# Patient Record
Sex: Female | Born: 1985 | Race: White | Hispanic: No | Marital: Married | Smoking: Former smoker
Health system: Southern US, Community
[De-identification: ages and names within clinical notes are randomized; demographics above are authoritative.]

## PROBLEM LIST (undated history)

## (undated) DIAGNOSIS — R011 Cardiac murmur, unspecified: Secondary | ICD-10-CM

## (undated) DIAGNOSIS — F32A Depression, unspecified: Secondary | ICD-10-CM

## (undated) DIAGNOSIS — F329 Major depressive disorder, single episode, unspecified: Secondary | ICD-10-CM

## (undated) HISTORY — DX: Depression, unspecified: F32.A

## (undated) HISTORY — DX: Cardiac murmur, unspecified: R01.1

## (undated) HISTORY — DX: Major depressive disorder, single episode, unspecified: F32.9

---

## 2008-04-02 ENCOUNTER — Ambulatory Visit (HOSPITAL_COMMUNITY): Admission: RE | Admit: 2008-04-02 | Discharge: 2008-04-02 | Payer: Self-pay | Admitting: Obstetrics and Gynecology

## 2008-04-23 ENCOUNTER — Ambulatory Visit (HOSPITAL_COMMUNITY): Admission: RE | Admit: 2008-04-23 | Discharge: 2008-04-23 | Payer: Self-pay | Admitting: Obstetrics and Gynecology

## 2008-05-23 ENCOUNTER — Ambulatory Visit (HOSPITAL_COMMUNITY): Admission: RE | Admit: 2008-05-23 | Discharge: 2008-05-23 | Payer: Self-pay | Admitting: Obstetrics and Gynecology

## 2008-08-04 ENCOUNTER — Inpatient Hospital Stay (HOSPITAL_COMMUNITY): Admission: AD | Admit: 2008-08-04 | Discharge: 2008-08-06 | Payer: Self-pay | Admitting: Obstetrics and Gynecology

## 2010-06-22 LAB — CBC
HCT: 30.4 % — ABNORMAL LOW (ref 36.0–46.0)
HCT: 38.6 % (ref 36.0–46.0)
Hemoglobin: 10.9 g/dL — ABNORMAL LOW (ref 12.0–15.0)
Hemoglobin: 13.5 g/dL (ref 12.0–15.0)
MCHC: 35.7 g/dL (ref 30.0–36.0)
MCV: 97.4 fL (ref 78.0–100.0)
MCV: 97.6 fL (ref 78.0–100.0)
Platelets: 213 10*3/uL (ref 150–400)
RBC: 3.11 MIL/uL — ABNORMAL LOW (ref 3.87–5.11)
RBC: 3.96 MIL/uL (ref 3.87–5.11)
RDW: 13.5 % (ref 11.5–15.5)
WBC: 18.3 10*3/uL — ABNORMAL HIGH (ref 4.0–10.5)

## 2012-03-09 ENCOUNTER — Ambulatory Visit: Payer: BC Managed Care – PPO | Admitting: Family Medicine

## 2012-03-09 VITALS — BP 122/77 | HR 80 | Temp 98.4°F | Resp 18 | Ht 65.0 in | Wt 140.2 lb

## 2012-03-09 DIAGNOSIS — J029 Acute pharyngitis, unspecified: Secondary | ICD-10-CM

## 2012-03-09 NOTE — Progress Notes (Signed)
[facility] [address] [place] [address] [phone]  Date:  03/09/2012   Name:  Darlene Watkins   DOB:  15-Oct-1985   MRN:  [id number]  PCP:  No primary provider on file.    Chief Complaint: Sore Throat   History of Present Illness:  Darlene Watkins is a 26 y.o. very pleasant female patient who presents with the following:  She is here with ST that started yesterday, she noted a white spot on her throat as well.  She has not noted a fever. She felt ill over the last few hours- "general blah feeling"  She has a mild, dry, cough.  No earache She has felt bloated today- no diarrhea.    No sick contacts at home.  She is generally quite healthy, no regular meds.    She has a mirena IUD.    There is no problem list on file for this patient.   Past Medical History  Diagnosis Date  . Depression   . Heart murmur     History reviewed. No pertinent past surgical history.  History  Substance Use Topics  . Smoking status: Current Every Day Smoker -- 1.0 packs/day    Types: Cigarettes  . Smokeless tobacco: Not on file  . Alcohol Use: Yes    Family History  Problem Relation Age of Onset  . Asthma Mother   . Alcohol abuse Father   . Asthma Sister   . Diabetes Maternal Grandmother     No Known Allergies  Medication list has been reviewed and updated.  No current outpatient prescriptions on file prior to visit.    Review of Systems:  As per HPI- otherwise negative.   Physical Examination: Filed Vitals:   03/09/12 1754  BP: 122/77  Pulse: 80  Temp: 98.4 F (36.9 C)  Resp: 18   Filed Vitals:   03/09/12 1754  Height: 5\' 5"  (1.651 m)  Weight: 140 lb 3.2 oz (63.594 kg)   Body mass index is 23.33 kg/(m^2). Ideal Body Weight: Weight in (lb) to have BMI = 25: 149.9   GEN: WDWN, NAD, Non-toxic, A & O x 3 HEENT: Atraumatic, Normocephalic. Neck supple. No masses, No LAD. Bilateral TM wnl, oropharynx slightly injected without  exudate. PEERL,EOMI.   Ears and Nose: No external deformity. CV: RRR, No G/R. No JVD. No thrill. No extra heart sounds. She has a known murmur- systolic, 2/6 PULM: CTA B, no wheezes, crackles, rhonchi. No retractions. No resp. distress. No accessory muscle use. ABD: S, ND, +BS. No rebound. No HSM.  She has very slight LUQ tenderness that she just noted a few hours ago EXTR: No c/c/e NEURO Normal gait.  PSYCH: Normally interactive. Conversant. Not depressed or anxious appearing.  Calm demeanor.   Results for orders placed in visit on 03/09/12  POCT RAPID STREP A (OFFICE)      Component Value Range   Rapid Strep A Screen Negative  Negative    Assessment and Plan: 1. Sore throat  POCT rapid strep A   See patient instructions for more details. slight LUQ tenderness for a few hours.  Jensine feels this is likely gas.  If it does not resolve within the next day she will let me know- Sooner if worse.      Abbe Amsterdam, MD

## 2012-03-09 NOTE — Patient Instructions (Addendum)
It appears that you have a viral sore throat.  Use OTC medications as needed. Let me know if you are not better in the next few days- Sooner if worse.

## 2014-08-02 ENCOUNTER — Encounter (HOSPITAL_BASED_OUTPATIENT_CLINIC_OR_DEPARTMENT_OTHER): Payer: Self-pay

## 2014-08-02 DIAGNOSIS — Z87891 Personal history of nicotine dependence: Secondary | ICD-10-CM | POA: Insufficient documentation

## 2014-08-02 DIAGNOSIS — L03116 Cellulitis of left lower limb: Secondary | ICD-10-CM | POA: Diagnosis not present

## 2014-08-02 DIAGNOSIS — Z8659 Personal history of other mental and behavioral disorders: Secondary | ICD-10-CM | POA: Diagnosis not present

## 2014-08-02 DIAGNOSIS — M7989 Other specified soft tissue disorders: Secondary | ICD-10-CM | POA: Diagnosis present

## 2014-08-02 DIAGNOSIS — L03115 Cellulitis of right lower limb: Secondary | ICD-10-CM | POA: Insufficient documentation

## 2014-08-02 DIAGNOSIS — R011 Cardiac murmur, unspecified: Secondary | ICD-10-CM | POA: Diagnosis not present

## 2014-08-02 NOTE — ED Notes (Signed)
Pt reports bilateral pedal edema +1 pitting since Wednesday, that has progressively worsened with pain. RLE edema noticeably worse. Pt denies Shortness of Breath. Reports being on Mirena. States she had an approximate 10 hour car ride on Saturday. Reports 10 hour drive on 3/875/16 as well.

## 2014-08-03 ENCOUNTER — Ambulatory Visit (HOSPITAL_BASED_OUTPATIENT_CLINIC_OR_DEPARTMENT_OTHER)
Admission: RE | Admit: 2014-08-03 | Discharge: 2014-08-03 | Disposition: A | Discharge status: Home / Self Care | Payer: BLUE CROSS/BLUE SHIELD | Source: Ambulatory Visit | Attending: Emergency Medicine | Admitting: Emergency Medicine

## 2014-08-03 ENCOUNTER — Emergency Department (HOSPITAL_BASED_OUTPATIENT_CLINIC_OR_DEPARTMENT_OTHER)
Admission: EM | Admit: 2014-08-03 | Discharge: 2014-08-03 | Disposition: A | Discharge status: Home / Self Care | Payer: BLUE CROSS/BLUE SHIELD | Attending: Emergency Medicine | Admitting: Emergency Medicine

## 2014-08-03 ENCOUNTER — Other Ambulatory Visit (HOSPITAL_BASED_OUTPATIENT_CLINIC_OR_DEPARTMENT_OTHER): Payer: Self-pay | Admitting: Emergency Medicine

## 2014-08-03 DIAGNOSIS — M79605 Pain in left leg: Secondary | ICD-10-CM | POA: Diagnosis not present

## 2014-08-03 DIAGNOSIS — M7989 Other specified soft tissue disorders: Secondary | ICD-10-CM

## 2014-08-03 DIAGNOSIS — L03115 Cellulitis of right lower limb: Secondary | ICD-10-CM

## 2014-08-03 DIAGNOSIS — M79604 Pain in right leg: Secondary | ICD-10-CM | POA: Diagnosis not present

## 2014-08-03 DIAGNOSIS — L03116 Cellulitis of left lower limb: Secondary | ICD-10-CM

## 2014-08-03 MED ORDER — CEPHALEXIN 500 MG PO CAPS: 500.0000 mg | ORAL_CAPSULE | Freq: Four times a day (QID) | ORAL | Status: DC

## 2014-08-03 NOTE — Discharge Instructions (Signed)
Keflex as prescribed.  Elevate your legs as often as possible for the next several days.  Return to the radiology department tomorrow at the given time for an ultrasound to rule out the possibility of blood clots.   Cellulitis Cellulitis is an infection of the skin and the tissue beneath it. The infected area is usually red and tender. Cellulitis occurs most often in the arms and lower legs.  CAUSES  Cellulitis is caused by bacteria that enter the skin through cracks or cuts in the skin. The most common types of bacteria that cause cellulitis are staphylococci and streptococci. SIGNS AND SYMPTOMS   Redness and warmth.  Swelling.  Tenderness or pain.  Fever. DIAGNOSIS  Your health care provider can usually determine what is wrong based on a physical exam. Blood tests may also be done. TREATMENT  Treatment usually involves taking an antibiotic medicine. HOME CARE INSTRUCTIONS   Take your antibiotic medicine as directed by your health care provider. Finish the antibiotic even if you start to feel better.  Keep the infected arm or leg elevated to reduce swelling.  Apply a warm cloth to the affected area up to 4 times per day to relieve pain.  Take medicines only as directed by your health care provider.  Keep all follow-up visits as directed by your health care provider. SEEK MEDICAL CARE IF:   You notice red streaks coming from the infected area.  Your red area gets larger or turns dark in color.  Your bone or joint underneath the infected area becomes painful after the skin has healed.  Your infection returns in the same area or another area.  You notice a swollen bump in the infected area.  You develop new symptoms.  You have a fever. SEEK IMMEDIATE MEDICAL CARE IF:   You feel very sleepy.  You develop vomiting or diarrhea.  You have a general ill feeling (malaise) with muscle aches and pains. MAKE SURE YOU:   Understand these instructions.  Will watch your  condition.  Will get help right away if you are not doing well or get worse. Document Released: 12/08/2004 Document Revised: 07/15/2013 Document Reviewed: 05/16/2011 [facility] Patient Information 2015 [employer] [place]. This information is not intended to replace advice given to you by your health care provider. Make sure you discuss any questions you have with your health care provider.

## 2014-08-03 NOTE — ED Notes (Signed)
Dr. Delo at [facility].  

## 2014-08-03 NOTE — ED Provider Notes (Signed)
CSN: [id number]     Arrival date & time 08/02/14  2349 History   First MD Initiated Contact with Patient 08/03/14 0153     Chief Complaint  Patient presents with  . Foot Swelling     (Consider location/radiation/quality/duration/timing/severity/associated sxs/prior Treatment) HPI Comments: Patient is a 29 year old female who presents with complaints of bilateral leg swelling. [place]. She is doing a good bit of walking in shoes which causes soreness to the back of her ankles in the Achilles area. There is redness bilaterally in this area and the swelling that extends into both feet, right greater than left. She denies any chest pain or shortness of breath. She denies any fevers or chills.  The history is provided by the patient.    Past Medical History  Diagnosis Date  . Depression   . Heart murmur    History reviewed. No pertinent past surgical history. Family History  Problem Relation Age of Onset  . Asthma Mother   . Alcohol abuse Father   . Asthma Sister   . Diabetes Maternal Grandmother    History  Substance Use Topics  . Smoking status: Former Smoker -- 1.00 packs/day    Types: Cigarettes  . Smokeless tobacco: Not on file  . Alcohol Use: Yes     Comment: daily   OB History    No data available     Review of Systems  All other systems reviewed and are negative.     Allergies  Sulfa antibiotics  Home Medications   Prior to Admission medications   Not on File   BP 125/83 mmHg  Pulse 84  Temp(Src) 98.3 F (36.8 C) (Oral)  Resp 16  Ht 5\' 5"  (1.651 m)  Wt 140 lb (63.504 kg)  BMI 23.30 kg/m2  SpO2 100% Physical Exam  Constitutional: She is oriented to person, place, and time. She appears well-developed and well-nourished. No distress.  HENT:  Head: Normocephalic and atraumatic.  Neck: Normal range of motion. Neck supple.  Musculoskeletal:  There are open sores to the Achilles area of both ankles. There is no  purulent drainage but there is some surrounding erythema and warmth. There is 1+ edema to the dorsum of the right foot and ankle.  Neurological: She is alert and oriented to person, place, and time.  Skin: Skin is warm and dry. She is not diaphoretic.  Nursing note and vitals reviewed.   ED Course  Procedures (including critical care time) Labs Review Labs Reviewed - No data to display  Imaging Review No results found.   EKG Interpretation None      MDM   Final diagnoses:  None    This appears to be cellulitis, however due to the recent travel, she will return for an ultrasound to rule out the possibility of DVT.    Geoffery Lyonsouglas Denise Bramblett, MD 08/03/14 [phone]

## 2014-08-03 NOTE — ED Notes (Addendum)
Care assumed at time of d/c. Pt seen by EDP prior to RN assessment, see MD notes, orders received to d/c. D/c'd by other RN, pt not seen by this RN. Pt alert, NAD, calm, interactive, resps e/u, speaking in clear complete sentences, no dyspnea noted, steady gait, ready to go. Family with pt.

## 2018-01-31 ENCOUNTER — Encounter: Payer: Self-pay | Admitting: Gastroenterology

## 2018-03-01 ENCOUNTER — Other Ambulatory Visit (INDEPENDENT_AMBULATORY_CARE_PROVIDER_SITE_OTHER): Payer: Managed Care, Other (non HMO)

## 2018-03-01 ENCOUNTER — Encounter: Payer: Self-pay | Admitting: Gastroenterology

## 2018-03-01 ENCOUNTER — Ambulatory Visit: Payer: Managed Care, Other (non HMO) | Admitting: Gastroenterology

## 2018-03-01 VITALS — BP 110/68 | HR 78 | Ht 65.0 in | Wt 143.0 lb

## 2018-03-01 DIAGNOSIS — R194 Change in bowel habit: Secondary | ICD-10-CM | POA: Diagnosis not present

## 2018-03-01 DIAGNOSIS — K648 Other hemorrhoids: Secondary | ICD-10-CM | POA: Diagnosis not present

## 2018-03-01 LAB — IGA: IgA: 194 mg/dL (ref 68–378)

## 2018-03-01 NOTE — Patient Instructions (Addendum)
If you are age 30 or older, your body mass index should be between 23-30. Your Body mass index is 23.8 kg/m. If this is out of the aforementioned range listed, please consider follow up with your Primary Care Provider.  If you are age 30 or younger, your body mass index should be between 19-25. Your Body mass index is 23.8 kg/m. If this is out of the aformentioned range listed, please consider follow up with your Primary Care Provider.   Please go to the lab in the basement of our building to have lab work done as you leave today. Hit "B" for basement when you get on the elevator.  When the doors open the lab is on your left.  We will call you with the results. Thank you.  We are giving you a Low FOD-Map diet to follow.  Purchase Citrucel powder over the counter and take take.  You can use 1% hydrocortisone cream - a pea sized amount - once to twice daily as needed.  Thank you for entrusting me with your care and for [facility], Dr. Ileene PatrickSteven Armbruster

## 2018-03-01 NOTE — Progress Notes (Signed)
HPI :  32 y/o healthy female with no significant past medical history, referred here by Dr. Jenita SeashoreSamuel Kelly here for evaluation for hemorrhoids and altered bowel habits.  She reports her stools historically have been a mix of loose stools as well as constipated stools. She thinks it around 50:50 between the two, perhaps a slight loose stool predominance. She averages 1 bowel movement per day, the alteration appears to be with stool form. She does have some occasional cramps, which can be relieved with a bowel movement. She also endorses abdominal distention and bloating which bothers her intermittently. She is otherwise eating okay, she denies any nausea or vomiting. She occasionally feels a prolapse of tissue from her anal canal which she suspects is a hemorrhoid. This can be irritated more so with loose stools. She denies any blood in her stools. She denies any significant discomfort when this occurs. 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 She's never had a prior surgery. She smokes cigarettes occasionally. She denies any family history of celiac disease, no history of Crohn's disease or IBD, no family history of colon cancer. She has tried some fiber , is in the past which have contributed to her bloating. She has tried some preparation in the past for hemorrhoids which hasn't helped much. She has read about a relation between HPV and anal cancer, in light of her symptoms she is concerned about this. She works as a IT consultantparalegal [place]     Past Medical History:  Diagnosis Date  . Depression   . Heart murmur   HPV   History reviewed. No pertinent surgical history. Family History  Problem Relation Age of Onset  . Asthma Mother   . Alcohol abuse Father   . Heart disease Father   . Asthma Sister   . Diabetes Maternal Grandmother   . Heart disease Maternal Grandfather    Social History    Tobacco Use  . Smoking status: Former Smoker    Packs/day: 1.00    Types: Cigarettes  . Smokeless tobacco: Never Used  Substance Use Topics  . Alcohol use: Yes    Comment: daily  . Drug use: No   Current Outpatient Medications  Medication Sig Dispense Refill  . Biotin 10 MG CAPS Take by mouth.    . drospirenone-ethinyl estradiol (YAZ,GIANVI,LORYNA) 3-0.02 MG tablet Take 1 tablet by mouth daily.     No current facility-administered medications for this visit.    Allergies  Allergen Reactions  . Sulfa Antibiotics     Rash     Review of Systems: All systems reviewed and negative except where noted in HPI.   Labs in Care-everywhere - 10/2015 - no anemia, normal LFTs and renal function  Physical Exam: BP 110/68   Pulse 78   Ht 5\' 5"  (1.651 m)   Wt 143 lb (64.9 kg)   BMI 23.80 kg/m  Constitutional: Pleasant,well-developed, female in no acute distress. HEENT: Normocephalic and atraumatic. Conjunctivae are normal. No scleral icterus. Neck supple.  Cardiovascular: Normal rate, regular rhythm.  Pulmonary/chest: Effort normal and breath sounds normal. No wheezing, rales or rhonchi. Abdominal: Soft, nondistended, nontender.There are no masses palpable. No hepatomegaly. DRE / Anoscopy - small internal hemorrhoids, no mass lesion, small external skin tags - standby Lucius ConnJan Hogan Extremities: no edema Lymphadenopathy: No cervical adenopathy noted. Neurological: Alert and oriented to person place and time. Skin: Skin is warm and  dry. No rashes noted. Psychiatric: Normal mood and affect. Behavior is normal.   ASSESSMENT AND PLAN: 32 year old female here for new patient consultation regarding the following issues:  Altered bowel habits / internal hemorrhoids - based on anoscopy and DRE today, I suspect her perianal symptoms are likely due to internal hemorrhoids. I reassured her. We discussed what hemorrhoids are, potential therapies. We also discussed her chronic bowel symptoms and  management of that. It's possible she may have irritable bowel syndrome. I think inflammatory bowel disease is very unlikely. Celiac disease is possible and recommend serologic testing for that with IgA and TTG. Otherwise given her alternating constipation and loose stools, recommend a trial of a daily fiber supplement. Specifically I would recommend Citrucel given it's unlikely to cause bloating which she also suffers from. I counseled her on a low FODMAP diet which will hopefully help regulate her bowels are reduce bloating as well. Hopefully these measures helped to treat her bowels and subsequently minimize symptoms from internal hemorrhoids. She can use 1% hydrocortisone cream as needed for symptomatically hemorrhoids. If hemorrhoids symptoms persist or worsen over time, could consider banding, however I think she will improve with conservative therapy at this time. She can follow up as needed or call with questions, will await lab work.   Ileene PatrickSteven Armbruster, MD [facility]  CC: Wilburn MylarKelly, Samuel S, MD

## 2018-03-02 LAB — TISSUE TRANSGLUTAMINASE, IGA: (TTG) AB, IGA: 1 U/mL

## 2019-03-22 ENCOUNTER — Ambulatory Visit: Payer: Managed Care, Other (non HMO) | Attending: Internal Medicine

## 2019-03-22 DIAGNOSIS — Z20822 Contact with and (suspected) exposure to covid-19: Secondary | ICD-10-CM

## 2019-03-24 LAB — NOVEL CORONAVIRUS, NAA: SARS-CoV-2, NAA: NOT DETECTED

## 2019-03-26 ENCOUNTER — Ambulatory Visit: Payer: Managed Care, Other (non HMO) | Attending: Internal Medicine

## 2019-03-26 DIAGNOSIS — Z20822 Contact with and (suspected) exposure to covid-19: Secondary | ICD-10-CM

## 2019-03-28 LAB — NOVEL CORONAVIRUS, NAA: SARS-CoV-2, NAA: NOT DETECTED

## 2019-06-13 ENCOUNTER — Other Ambulatory Visit: Payer: Managed Care, Other (non HMO)

## 2019-06-14 ENCOUNTER — Ambulatory Visit: Payer: Managed Care, Other (non HMO) | Attending: Internal Medicine

## 2019-06-14 DIAGNOSIS — Z20822 Contact with and (suspected) exposure to covid-19: Secondary | ICD-10-CM

## 2019-06-15 LAB — NOVEL CORONAVIRUS, NAA: SARS-CoV-2, NAA: NOT DETECTED

## 2019-06-15 LAB — SARS-COV-2, NAA 2 DAY TAT

## 2020-07-02 ENCOUNTER — Ambulatory Visit
Admission: RE | Admit: 2020-07-02 | Discharge: 2020-07-02 | Disposition: A | Discharge status: Home / Self Care | Payer: BLUE CROSS/BLUE SHIELD | Source: Ambulatory Visit | Attending: Emergency Medicine | Admitting: Emergency Medicine

## 2020-07-02 ENCOUNTER — Other Ambulatory Visit: Payer: Self-pay

## 2020-07-02 VITALS — BP 114/70 | HR 65 | Temp 98.4°F | Resp 16

## 2020-07-02 DIAGNOSIS — Z87891 Personal history of nicotine dependence: Secondary | ICD-10-CM | POA: Insufficient documentation

## 2020-07-02 DIAGNOSIS — U071 COVID-19: Secondary | ICD-10-CM | POA: Insufficient documentation

## 2020-07-02 DIAGNOSIS — J069 Acute upper respiratory infection, unspecified: Secondary | ICD-10-CM

## 2020-07-02 DIAGNOSIS — Z882 Allergy status to sulfonamides status: Secondary | ICD-10-CM | POA: Insufficient documentation

## 2020-07-02 DIAGNOSIS — J029 Acute pharyngitis, unspecified: Secondary | ICD-10-CM | POA: Diagnosis present

## 2020-07-02 DIAGNOSIS — Z1152 Encounter for screening for COVID-19: Secondary | ICD-10-CM | POA: Diagnosis not present

## 2020-07-02 LAB — POCT RAPID STREP A (OFFICE): Rapid Strep A Screen: NEGATIVE

## 2020-07-02 NOTE — ED Provider Notes (Signed)
[facility]    CSN: [id number] Arrival date & time: 07/02/20  1149      History   Chief Complaint Chief Complaint  Patient presents with  . Sore Throat    HPI Darlene Watkins is a 35 y.o. female.   Patient presents with sore throat x4 days.  She also reports sneezing, sinus congestion, headache, nonproductive cough.  She denies fever, rash, shortness of breath, vomiting, diarrhea, or other symptoms.  Treatment attempted at home with ibuprofen and Alka-Seltzer plus.  Her medical history includes heart murmur, anxiety, premenstrual dysphoric disorder, and depression.  The history is provided by the patient and medical records.    Past Medical History:  Diagnosis Date  . Depression   . Heart murmur     There are no problems to display for this patient.   History reviewed. No pertinent surgical history.  OB History   No obstetric history on file.      Home Medications    Prior to Admission medications   Medication Sig Start Date End Date Taking? Authorizing Provider  Multiple Vitamin (MULTIVITAMIN) capsule Take 1 capsule by mouth daily.   Yes [provider]  sertraline (ZOLOFT) 50 MG tablet Take 50 mg by mouth daily. 1.5 tablets per day to total 75mg  daily   Yes [provider]  Biotin 10 MG CAPS Take by mouth.    [provider]  drospirenone-ethinyl estradiol (YAZ,GIANVI,LORYNA) 3-0.02 MG tablet Take 1 tablet by mouth daily.    [provider]    Family History Family History  Problem Relation Age of Onset  . Asthma Mother   . Alcohol abuse Father   . Heart disease Father   . Asthma Sister   . Diabetes Maternal Grandmother   . Heart disease Maternal Grandfather     Social History Social History   Tobacco Use  . Smoking status: Former Smoker    Packs/day: 1.00    Types: Cigarettes  . Smokeless tobacco: Never Used  Vaping Use  . Vaping Use: Never used  Substance Use Topics  . Alcohol use: Yes     Comment: weekends   . Drug use: No     Allergies   Sulfa antibiotics   Review of Systems Review of Systems  Constitutional: Negative for chills and fever.  HENT: Positive for congestion, sneezing and sore throat. Negative for ear pain.   Eyes: Negative for pain and visual disturbance.  Respiratory: Positive for cough. Negative for shortness of breath.   Cardiovascular: Negative for chest pain and palpitations.  Gastrointestinal: Negative for abdominal pain and vomiting.  Genitourinary: Negative for dysuria and hematuria.  Musculoskeletal: Negative for arthralgias and back pain.  Skin: Negative for color change and rash.  Neurological: Positive for headaches. Negative for seizures and syncope.  All other systems reviewed and are negative.    Physical Exam Triage Vital Signs ED Triage Vitals  Enc Vitals Group     BP      Pulse      Resp      Temp      Temp src      SpO2      Weight      Height      Head Circumference      Peak Flow      Pain Score      Pain Loc      Pain Edu?      Excl. in GC?    No data found.  Updated Vital Signs BP 114/70 (BP Location: Left Arm)   Pulse 65   Temp 98.4 F (36.9 C) (Oral)   Resp 16   LMP 06/18/2020   SpO2 97%   Visual Acuity Right Eye Distance:   Left Eye Distance:   Bilateral Distance:    Right Eye Near:   Left Eye Near:    Bilateral Near:     Physical Exam Vitals and nursing note reviewed.  Constitutional:      General: She is not in acute distress.    Appearance: She is well-developed.  HENT:     Head: Normocephalic and atraumatic.     Right Ear: Tympanic membrane normal.     Left Ear: Tympanic membrane normal.     Nose: Nose normal.     Mouth/Throat:     Mouth: Mucous membranes are moist.     Pharynx: Oropharynx is clear. Posterior oropharyngeal erythema present. No oropharyngeal exudate.  Eyes:     Conjunctiva/sclera: Conjunctivae normal.  Cardiovascular:     Rate and Rhythm: Normal rate and regular  rhythm.     Heart sounds: Normal heart sounds.  Pulmonary:     Effort: Pulmonary effort is normal. No respiratory distress.     Breath sounds: Normal breath sounds.  Abdominal:     Palpations: Abdomen is soft.     Tenderness: There is no abdominal tenderness.  Musculoskeletal:     Cervical back: Neck supple.  Skin:    General: Skin is warm and dry.     Findings: No rash.  Neurological:     General: No focal deficit present.     Mental Status: She is alert and oriented to person, place, and time.     Gait: Gait normal.  Psychiatric:        Mood and Affect: Mood normal.        Behavior: Behavior normal.      UC Treatments / Results  Labs (all labs ordered are listed, but only abnormal results are displayed) Labs Reviewed  NOVEL CORONAVIRUS, NAA  CULTURE, GROUP A STREP [facility])  POCT RAPID STREP A (OFFICE)    EKG   Radiology No results found.  Procedures Procedures (including critical care time)  Medications Ordered in UC Medications - No data to display  Initial Impression / Assessment and Plan / UC Course  I have reviewed the triage vital signs and the nursing notes.  Pertinent labs & imaging results that were available during my care of the patient were reviewed by me and considered in my medical decision making (see chart for details).   Viral URI, encounter for COVID-19 test, sore throat.  Rapid strep negative; culture pending.  PCR COVID pending.  Instructed patient to self quarantine until the test result is back.  Discussed symptomatic treatment including Tylenol or ibuprofen as needed for fever or discomfort and Mucinex as needed for congestion.  Instructed patient to rest and stay hydrated.  Discussed that she should follow-up with her PCP if her symptoms are not improving.  She agrees to plan of care.   Final Clinical Impressions(s) / UC Diagnoses   Final diagnoses:  Encounter for screening for COVID-19  Viral URI  Sore throat     Discharge  Instructions     Your rapid strep test is negative.  A throat culture is pending; we will call you if it is positive requiring treatment.    Your COVID test is pending.  You should self quarantine until the test result is  back.    Take Tylenol or ibuprofen as needed for fever or discomfort.  Take Mucinex as needed for congestion.  Rest and keep yourself hydrated.    Follow-up with your primary care provider if your symptoms are not improving.          ED Prescriptions    None     PDMP not reviewed this encounter.   Mickie Bail, NP 07/02/20 1230

## 2020-07-02 NOTE — ED Triage Notes (Signed)
Pt presents with sore throat x 4 days as well as sinus congestion, sneezing, HA.  Took Alka Seltzer and Ibuprofen. No fevers.

## 2020-07-02 NOTE — Discharge Instructions (Signed)
Your rapid strep test is negative.  A throat culture is pending; we will call you if it is positive requiring treatment.    Your COVID test is pending.  You should self quarantine until the test result is back.    Take Tylenol or ibuprofen as needed for fever or discomfort.  Take Mucinex as needed for congestion.  Rest and keep yourself hydrated.    Follow-up with your primary care provider if your symptoms are not improving.

## 2020-07-03 LAB — SARS-COV-2, NAA 2 DAY TAT

## 2020-07-03 LAB — NOVEL CORONAVIRUS, NAA: SARS-CoV-2, NAA: DETECTED — AB

## 2020-07-03 LAB — CULTURE, GROUP A STREP (THRC)

## 2020-07-04 LAB — CULTURE, GROUP A STREP (THRC)

## 2020-10-07 ENCOUNTER — Other Ambulatory Visit: Payer: Self-pay

## 2020-10-07 ENCOUNTER — Encounter: Payer: Self-pay | Admitting: Emergency Medicine

## 2020-10-07 ENCOUNTER — Ambulatory Visit
Admission: EM | Admit: 2020-10-07 | Discharge: 2020-10-07 | Disposition: A | Discharge status: Home / Self Care | Payer: PRIVATE HEALTH INSURANCE | Attending: Emergency Medicine | Admitting: Emergency Medicine

## 2020-10-07 ENCOUNTER — Ambulatory Visit: Payer: BLUE CROSS/BLUE SHIELD

## 2020-10-07 DIAGNOSIS — Z20822 Contact with and (suspected) exposure to covid-19: Secondary | ICD-10-CM

## 2020-10-07 DIAGNOSIS — B349 Viral infection, unspecified: Secondary | ICD-10-CM | POA: Diagnosis not present

## 2020-10-07 LAB — POCT RAPID STREP A (OFFICE): Rapid Strep A Screen: NEGATIVE

## 2020-10-07 NOTE — ED Provider Notes (Signed)
CHIEF COMPLAINT:   Chief Complaint  Patient presents with   Sore Throat   Nasal Congestion   APPT 1245     SUBJECTIVE/HPI:   Sore Throat  A very pleasant 35 y.o.Female presents today with sore throat, congestion and intermittent headache which started last night.  Patient reports a scratchy irritated throat.  Patient states that her husband had COVID-19 a few weeks ago, but no additional known sick contacts.  Patient has not tried any medications at home.  Patient is requesting a COVID-19 test. Patient does not report any shortness of breath, chest pain, palpitations, visual changes, weakness, tingling, headache, nausea, vomiting, diarrhea, fever, chills.   has a past medical history of Depression and Heart murmur.  ROS:  Review of Systems See Subjective/HPI Medications, Allergies and Problem List personally reviewed in Epic today OBJECTIVE:   Vitals:   10/07/20 1259  BP: (!) 108/55  Pulse: 68  Resp: 13  Temp: 99 F (37.2 C)  SpO2: 96%    Physical Exam   General: Appears well-developed and well-nourished. No acute distress.  HEENT Head: Normocephalic and atraumatic. Ears: Hearing grossly intact, no drainage or visible deformity.  Nose: No nasal deviation.  Erythematous and congested nasal mucosa noted bilaterally with rhinorrhea Mouth/Throat: No stridor or tracheal deviation.  Mildly erythematous posterior pharynx noted with clear drainage present.  No white patchy exudate noted. Eyes: Conjunctivae and EOM are normal. No eye drainage or scleral icterus bilaterally.  Neck: Normal range of motion, neck is supple. Cardiovascular: Normal rate. Regular rhythm; no murmurs, gallops, or rubs.  Pulm/Chest: No respiratory distress. Breath sounds normal bilaterally without wheezes, rhonchi, or rales.  Neurological: Alert and oriented to person, place, and time.  Skin: Skin is warm and dry.  No rashes, lesions, abrasions or bruising noted to skin.   Psychiatric: Normal mood,  affect, behavior, and thought content.   Vital signs and nursing note reviewed.   Patient stable and cooperative with examination. PROCEDURES:    LABS/X-RAYS/EKG/MEDS:   Results for orders placed or performed during the hospital encounter of 10/07/20  POCT rapid strep A  Result Value Ref Range   Rapid Strep A Screen Negative Negative   -Strep negative MEDICAL DECISION MAKING:   Patient presents with ore throat, congestion and intermittent headache which started last night.  Patient reports a scratchy irritated throat.  Patient states that her husband had COVID-19 a few weeks ago, but no additional known sick contacts.  Patient has not tried any medications at home.  Patient is requesting a COVID-19 test. Patient does not report any shortness of breath, chest pain, palpitations, visual changes, weakness, tingling, headache, nausea, vomiting, diarrhea, fever, chills.  Chart review completed.  Strep negative.  COVID-19 PCR obtained in clinic today.  Given symptoms along with assessment findings, likely viral illness.  Advised about home treatment and care along with strict return precautions for worsening of symptoms.  Work note provided.  Return as needed.  Patient verbalized understanding and agreed with treatment plan.  Patient stable upon discharge. ASSESSMENT/PLAN:  1. Encounter for screening laboratory testing for COVID-19 virus - Novel Coronavirus, NAA ([facility]); Standing - Novel Coronavirus, NAA ([facility])  2. Viral illness - Novel Coronavirus, NAA ([facility]); Standing - Novel Coronavirus, NAA ([facility])  No orders of the defined types were placed in this encounter.   Instructions about new medications and side effects provided.  Plan:   Discharge Instructions      We will call you with any positive results from your COVID-19  testing completed in clinic today.  If you do not receive a phone call from [place] within the next 2-3 days, check your MyChart for up-to-date health  information related to testing completed in clinic today.   For most people this is a self-limiting process and can take anywhere from 7 - 10 days to start feeling better. A cough can last up to 3 weeks. Pay special attention to handwashing as this can help prevent the spread of the virus.   Always read the labels of cough and cold medications as they may contain some of the ingredients below.  Rest, push lots of fluids (especially water), and utilize supportive care for symptoms. You may take acetaminophen (Tylenol) every 4-6 hours and ibuprofen every 6-8 hours for muscle pain, joint pain, headaches (you may also alternate these medications). Mucinex (guaifenesin) may be taken over the counter for cough as needed can loosen phlegm. Please read the instructions and take as directed.  Sudafed (pseudophedrine) is sold behind the counter and can help reduce nasal pressure; avoid taking this if you have high blood pressure or feel jittery. Sudafed PE (phenylephrine) can be a helpful, short-term, over-the-counter alternative to limit side effects or if you have high blood pressure.  Flonase nasal spray can help alleviate congestion and sinus pressure. Many patients choose Afrin as a nasal decongestant; do not use for more than 3 days for risk of rebound (increased symptoms after stopping medication).  Saline nasal sprays or rinses can also help nasal congestion (use bottled or sterile water). Warm tea with lemon and honey can sooth sore throat and cough, as can cough drops.   Return to clinic for high fever not improving with medications, chest pain, difficulty breathing, non-stop vomiting, or coughing blood. Follow-up with your primary care provider if symptoms do not improve as expected in the next 5-7 days.        A copy of these instructions have been given to the patient or responsible adult who demonstrated the ability to learn, asked appropriate questions, and verbalized understanding of the  plan of care.  There were no barriers to learning identified.    Amalia Greenhouse, FNP-C 10/07/20  This note was partially made with the aid of speech-to-text dictation; typographical errors are not intentional.    Amalia Greenhouse, FNP 10/07/20 1345

## 2020-10-07 NOTE — Discharge Instructions (Addendum)

## 2020-10-07 NOTE — ED Triage Notes (Signed)
Patient c/o sore throat and nasal congestion x 1 day.   Patient denies fever.   Patient is requesting COVID testing.   Patient endorses " a scratchy, irritated throat".   Patient endorses headache at times.   Patient hasn't taken any medications for symptoms.

## 2020-10-08 LAB — SARS-COV-2, NAA 2 DAY TAT

## 2020-10-08 LAB — NOVEL CORONAVIRUS, NAA: SARS-CoV-2, NAA: NOT DETECTED

## 2021-07-20 ENCOUNTER — Ambulatory Visit
Admission: EM | Admit: 2021-07-20 | Discharge: 2021-07-20 | Disposition: A | Discharge status: Home / Self Care | Payer: 59 | Attending: Student | Admitting: Student

## 2021-07-20 ENCOUNTER — Ambulatory Visit (INDEPENDENT_AMBULATORY_CARE_PROVIDER_SITE_OTHER): Payer: 59

## 2021-07-20 DIAGNOSIS — S62662A Nondisplaced fracture of distal phalanx of right middle finger, initial encounter for closed fracture: Secondary | ICD-10-CM

## 2021-07-20 NOTE — Discharge Instructions (Addendum)
-  Finger splint until follow-up with hand doctor  ?-You can take this off to wash your hands, but leave on as much as possible ?-You can take Tylenol up to 1000 mg 3 times daily, and ibuprofen up to 600 mg 3 times daily with food.  You can take these together, or alternate every 3-4 hours. ?-Ice, rest ? ?

## 2021-07-20 NOTE — ED Provider Notes (Signed)
?[facility] ? ? ? ?CSN: [id number] ?Arrival date & time: 07/20/21  0856 ? ? ?  ? ?History   ?Chief Complaint ?Chief Complaint  ?Patient presents with  ? Finger Injury  ?  Entered by patient  ? ? ?HPI ?Darlene Watkins is a 36 y.o. female presenting with right middle finger pain for about 1 hour following injury at home.  History noncontributory.  Describes pain over the distal aspect of the right middle finger for about 1 hour.  States that her dog lunged and the leash caught on her finger.  She states that it is painful over the distal aspect of the finger, and she is concerned with the bruising that is developing.  She is right-handed.  Denies sensation changes. ? ?HPI ? ?Past Medical History:  ?Diagnosis Date  ? Depression   ? Heart murmur   ? ? ?There are no problems to display for this patient. ? ? ?History reviewed. No pertinent surgical history. ? ?OB History   ?No obstetric history on file. ?  ? ? ? ?Home Medications   ? ?Prior to Admission medications   ?Medication Sig Start Date End Date Taking? Authorizing Provider  ?Biotin 10 MG CAPS Take by mouth.    [provider]  ?drospirenone-ethinyl estradiol (YAZ,GIANVI,LORYNA) 3-0.02 MG tablet Take 1 tablet by mouth daily.    [provider]  ?Multiple Vitamin (MULTIVITAMIN) capsule Take 1 capsule by mouth daily.    [provider]  ?sertraline (ZOLOFT) 50 MG tablet Take 50 mg by mouth daily. 1.5 tablets per day to total 75mg  daily    [provider]  ? ? ?Family History ?Family History  ?Problem Relation Age of Onset  ? Asthma Mother   ? Alcohol abuse Father   ? Heart disease Father   ? Asthma Sister   ? Diabetes Maternal Grandmother   ? Heart disease Maternal Grandfather   ? ? ?Social History ?Social History  ? ?Tobacco Use  ? Smoking status: Former  ?  Packs/day: 1.00  ?  Types: Cigarettes  ? Smokeless tobacco: Never  ?Vaping Use  ? Vaping Use: Never used  ?Substance Use Topics  ? Alcohol use: Yes  ?  Comment:  weekends   ? Drug use: No  ? ? ? ?Allergies   ?Sulfa antibiotics ? ? ?Review of Systems ?Review of Systems  ?Musculoskeletal:   ?     R middle finger pain   ?All other systems reviewed and are negative. ? ? ?Physical Exam ?Triage Vital Signs ?ED Triage Vitals  ?Enc Vitals Group  ?   BP 07/20/21 0922 107/70  ?   Pulse Rate 07/20/21 0922 89  ?   Resp 07/20/21 0922 18  ?   Temp 07/20/21 0922 98.2 ?F (36.8 ?C)  ?   Temp Source 07/20/21 0922 Temporal  ?   SpO2 07/20/21 0922 98 %  ?   Weight --   ?   Height --   ?   Head Circumference --   ?   Peak Flow --   ?   Pain Score 07/20/21 0921 8  ?   Pain Loc --   ?   Pain Edu? --   ?   Excl. in GC? --   ? ?No data found. ? ?Updated Vital Signs ?BP 107/70 (BP Location: Left Arm)   Pulse 89   Temp 98.2 ?F (36.8 ?C) (Temporal)   Resp 18   LMP 07/06/2021   SpO2 98%  ? ?  Visual Acuity ?Right Eye Distance:   ?Left Eye Distance:   ?Bilateral Distance:   ? ?Right Eye Near:   ?Left Eye Near:    ?Bilateral Near:    ? ?Physical Exam ?Vitals reviewed.  ?Constitutional:   ?   General: She is not in acute distress. ?   Appearance: Normal appearance. She is not ill-appearing.  ?HENT:  ?   Head: Normocephalic and atraumatic.  ?Pulmonary:  ?   Effort: Pulmonary effort is normal.  ?Musculoskeletal:  ?   Comments: R middle finger - faint ecchymosis distal phalanx, minimal swelling. Nail is well adhered but with bruising at the base of the nail. ROM finger intact but pain and stiffness with flexion DIP. ROM extension intact; there is no mallet finger. Cap refill <2 seconds, sensation intact, radial pulse 2+.  ?Neurological:  ?   General: No focal deficit present.  ?   Mental Status: She is alert and oriented to person, place, and time.  ?Psychiatric:     ?   Mood and Affect: Mood normal.     ?   Behavior: Behavior normal.     ?   Thought Content: Thought content normal.     ?   Judgment: Judgment normal.  ? ? ? ?UC Treatments / Results  ?Labs ?(all labs ordered are listed, but only abnormal  results are displayed) ?Labs Reviewed - No data to display ? ?EKG ? ? ?Radiology ?DG Finger Middle Right ? ?Result Date: 07/20/2021 ?CLINICAL DATA:  Right middle finger pain after getting it caught on the dog collar earlier this morning. EXAM: RIGHT MIDDLE FINGER 2+V COMPARISON:  None Available. FINDINGS: Acute nondisplaced fracture through the radial base of the distal phalanx of the long finger. Fracture line visible only on a single view but fairly definitive in appearance. There is associated soft tissue swelling. IMPRESSION: Acute nondisplaced fracture through the radial base of the distal phalanx of the long finger. Findings may represent avulsion fracture from the radial collateral ligament. Electronically Signed   By: Malachy Moan M.D.   On: 07/20/2021 10:02   ? ?Procedures ?Procedures (including critical care time) ? ?Medications Ordered in UC ?Medications - No data to display ? ?Initial Impression / Assessment and Plan / UC Course  ?I have reviewed the triage vital signs and the nursing notes. ? ?Pertinent labs & imaging results that were available during my care of the patient were reviewed by me and considered in my medical decision making (see chart for details). ? ?  ? ?This patient is a very pleasant 36 y.o. year old female presenting with R middle finger distal phalanx fracture. Neurovascularly intact. ? ?Xray R hand -  Acute nondisplaced fracture through the radial base of the distal phalanx of the long finger. Findings may represent avulsion fracture from the radial collateral ligament. ? ?Placed in finger splint; f/u with hand. Information provided. As there is no mallet finger, she can remove the splint to wash hands, but otherwise keep this in place.  ? ?ED return precautions discussed. Patient verbalizes understanding and agreement.  ? ? ?Final Clinical Impressions(s) / UC Diagnoses  ? ?Final diagnoses:  ?Nondisplaced fracture of distal phalanx of right middle finger, initial encounter for  closed fracture  ? ? ? ?Discharge Instructions   ? ?  ?-Finger splint until follow-up with hand doctor  ?-You can take this off to wash your hands, but leave on as much as possible ?-You can take Tylenol up to 1000 mg 3 times  daily, and ibuprofen up to 600 mg 3 times daily with food.  You can take these together, or alternate every 3-4 hours. ?-Ice, rest ? ? ? ?ED Prescriptions   ?None ?  ? ?PDMP not reviewed this encounter. ?  ?Rhys Martini, PA-C ?07/20/21 1111 ? ?

## 2021-07-20 NOTE — ED Provider Notes (Deleted)
Patient with small R middle finger laceration - concerned she needs stitches. Evaluated in triage; laceration is 1cm and shallow with no active bleeding. Neurovascularly intact. Patient advised that it does not need sutures, though I could apply dermabond. She preferred to manage at home with bandaid; I am in agreement.  ?  ?Rhys Martini, PA-C ?07/20/21 6834 ? ?

## 2021-07-20 NOTE — ED Triage Notes (Signed)
Patient presents to Urgent Care with complaints of a right middle finger injury about an hour ago. She states her dog was lunging and pulled the leash causing pain to her middle finger.  ?

## 2022-01-28 DIAGNOSIS — F4323 Adjustment disorder with mixed anxiety and depressed mood: Secondary | ICD-10-CM | POA: Diagnosis not present

## 2022-02-07 DIAGNOSIS — Z23 Encounter for immunization: Secondary | ICD-10-CM | POA: Diagnosis not present

## 2022-02-07 DIAGNOSIS — F419 Anxiety disorder, unspecified: Secondary | ICD-10-CM | POA: Diagnosis not present

## 2022-02-07 DIAGNOSIS — N943 Premenstrual tension syndrome: Secondary | ICD-10-CM | POA: Diagnosis not present

## 2022-02-07 DIAGNOSIS — Z0000 Encounter for general adult medical examination without abnormal findings: Secondary | ICD-10-CM | POA: Diagnosis not present

## 2022-02-07 DIAGNOSIS — L732 Hidradenitis suppurativa: Secondary | ICD-10-CM | POA: Diagnosis not present

## 2022-02-11 DIAGNOSIS — F4323 Adjustment disorder with mixed anxiety and depressed mood: Secondary | ICD-10-CM | POA: Diagnosis not present

## 2022-02-21 DIAGNOSIS — F4323 Adjustment disorder with mixed anxiety and depressed mood: Secondary | ICD-10-CM | POA: Diagnosis not present

## 2022-03-18 DIAGNOSIS — F4323 Adjustment disorder with mixed anxiety and depressed mood: Secondary | ICD-10-CM | POA: Diagnosis not present

## 2022-04-13 DIAGNOSIS — R059 Cough, unspecified: Secondary | ICD-10-CM | POA: Diagnosis not present

## 2022-04-13 DIAGNOSIS — B349 Viral infection, unspecified: Secondary | ICD-10-CM | POA: Diagnosis not present

## 2022-04-20 DIAGNOSIS — F4323 Adjustment disorder with mixed anxiety and depressed mood: Secondary | ICD-10-CM | POA: Diagnosis not present

## 2022-05-04 DIAGNOSIS — F4323 Adjustment disorder with mixed anxiety and depressed mood: Secondary | ICD-10-CM | POA: Diagnosis not present

## 2022-05-18 DIAGNOSIS — L814 Other melanin hyperpigmentation: Secondary | ICD-10-CM | POA: Diagnosis not present

## 2022-05-18 DIAGNOSIS — L308 Other specified dermatitis: Secondary | ICD-10-CM | POA: Diagnosis not present

## 2022-05-18 DIAGNOSIS — D229 Melanocytic nevi, unspecified: Secondary | ICD-10-CM | POA: Diagnosis not present

## 2022-05-18 DIAGNOSIS — Z86018 Personal history of other benign neoplasm: Secondary | ICD-10-CM | POA: Diagnosis not present

## 2022-05-25 DIAGNOSIS — F4323 Adjustment disorder with mixed anxiety and depressed mood: Secondary | ICD-10-CM | POA: Diagnosis not present

## 2022-06-14 DIAGNOSIS — Z01419 Encounter for gynecological examination (general) (routine) without abnormal findings: Secondary | ICD-10-CM | POA: Diagnosis not present

## 2022-06-15 DIAGNOSIS — F4323 Adjustment disorder with mixed anxiety and depressed mood: Secondary | ICD-10-CM | POA: Diagnosis not present

## 2022-07-04 DIAGNOSIS — F4323 Adjustment disorder with mixed anxiety and depressed mood: Secondary | ICD-10-CM | POA: Diagnosis not present

## 2022-07-27 DIAGNOSIS — F4323 Adjustment disorder with mixed anxiety and depressed mood: Secondary | ICD-10-CM | POA: Diagnosis not present

## 2022-08-29 DIAGNOSIS — F4323 Adjustment disorder with mixed anxiety and depressed mood: Secondary | ICD-10-CM | POA: Diagnosis not present

## 2022-09-21 DIAGNOSIS — F4323 Adjustment disorder with mixed anxiety and depressed mood: Secondary | ICD-10-CM | POA: Diagnosis not present

## 2022-10-12 DIAGNOSIS — F4323 Adjustment disorder with mixed anxiety and depressed mood: Secondary | ICD-10-CM | POA: Diagnosis not present

## 2022-10-31 DIAGNOSIS — F4323 Adjustment disorder with mixed anxiety and depressed mood: Secondary | ICD-10-CM | POA: Diagnosis not present

## 2022-11-16 DIAGNOSIS — F4323 Adjustment disorder with mixed anxiety and depressed mood: Secondary | ICD-10-CM | POA: Diagnosis not present

## 2022-11-22 DIAGNOSIS — Z3202 Encounter for pregnancy test, result negative: Secondary | ICD-10-CM | POA: Diagnosis not present

## 2022-11-22 DIAGNOSIS — Z3141 Encounter for fertility testing: Secondary | ICD-10-CM | POA: Diagnosis not present

## 2022-11-28 DIAGNOSIS — F4323 Adjustment disorder with mixed anxiety and depressed mood: Secondary | ICD-10-CM | POA: Diagnosis not present

## 2022-12-14 DIAGNOSIS — F4323 Adjustment disorder with mixed anxiety and depressed mood: Secondary | ICD-10-CM | POA: Diagnosis not present

## 2023-02-13 DIAGNOSIS — Z0000 Encounter for general adult medical examination without abnormal findings: Secondary | ICD-10-CM | POA: Diagnosis not present

## 2023-02-13 DIAGNOSIS — N943 Premenstrual tension syndrome: Secondary | ICD-10-CM | POA: Diagnosis not present

## 2023-02-13 DIAGNOSIS — E7800 Pure hypercholesterolemia, unspecified: Secondary | ICD-10-CM | POA: Diagnosis not present

## 2023-02-13 DIAGNOSIS — F1099 Alcohol use, unspecified with unspecified alcohol-induced disorder: Secondary | ICD-10-CM | POA: Diagnosis not present

## 2023-02-13 DIAGNOSIS — Z131 Encounter for screening for diabetes mellitus: Secondary | ICD-10-CM | POA: Diagnosis not present

## 2023-02-13 DIAGNOSIS — F419 Anxiety disorder, unspecified: Secondary | ICD-10-CM | POA: Diagnosis not present

## 2023-03-20 DIAGNOSIS — Z3169 Encounter for other general counseling and advice on procreation: Secondary | ICD-10-CM | POA: Diagnosis not present

## 2023-04-04 DIAGNOSIS — Z319 Encounter for procreative management, unspecified: Secondary | ICD-10-CM | POA: Diagnosis not present

## 2023-04-17 DIAGNOSIS — N979 Female infertility, unspecified: Secondary | ICD-10-CM | POA: Diagnosis not present

## 2023-05-22 DIAGNOSIS — R300 Dysuria: Secondary | ICD-10-CM | POA: Diagnosis not present

## 2023-06-19 DIAGNOSIS — Z01419 Encounter for gynecological examination (general) (routine) without abnormal findings: Secondary | ICD-10-CM | POA: Diagnosis not present

## 2023-06-19 DIAGNOSIS — Z1331 Encounter for screening for depression: Secondary | ICD-10-CM | POA: Diagnosis not present

## 2023-06-23 IMAGING — DX DG FINGER MIDDLE 2+V*R*
3 series · 3 of 3 positions shown · non-contrast
Comparison: None Available.

CLINICAL DATA: Right middle finger pain after getting it caught on
the dog collar earlier this morning.

EXAM:
RIGHT MIDDLE FINGER 2+V

[2. finger pa]
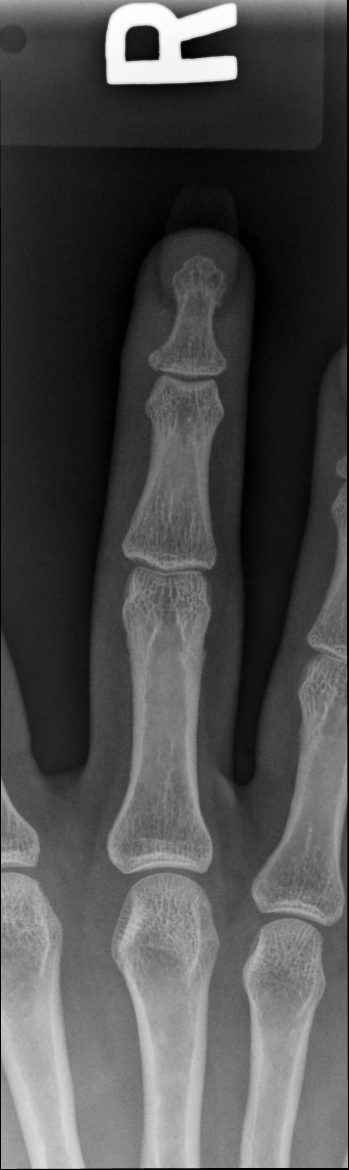

[finger lat]
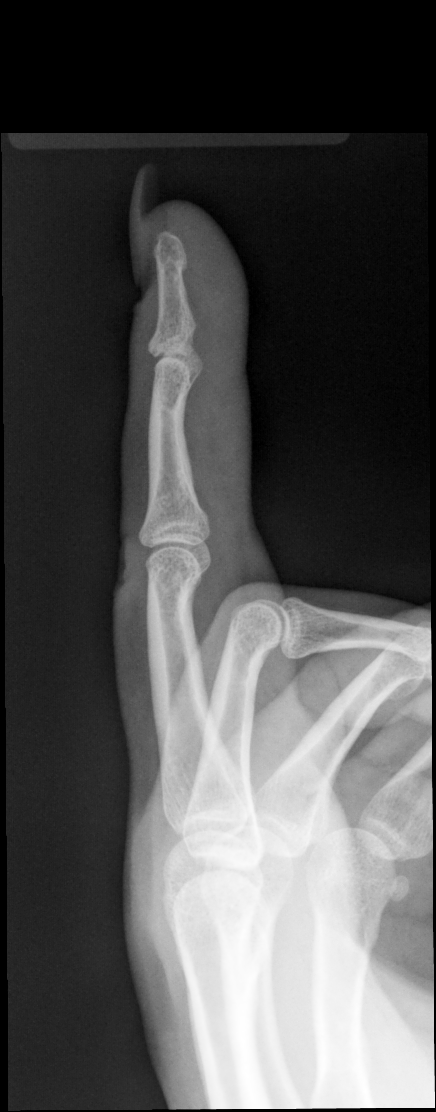

[finger mlo]
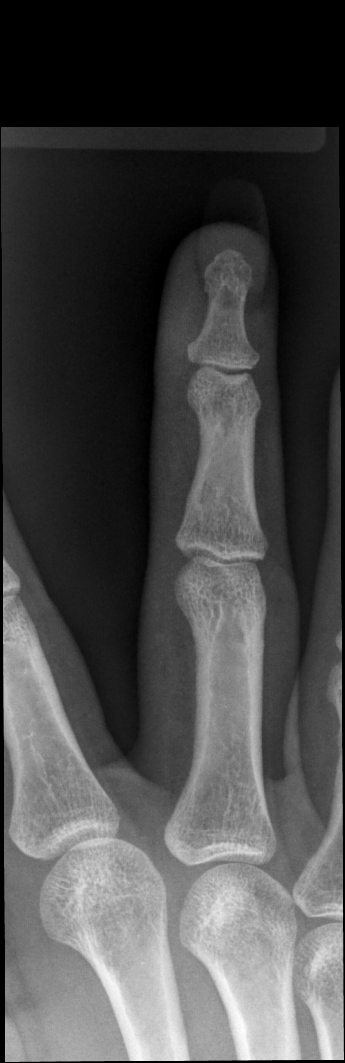

[3 of 3 positions shown; findings below may reference images not displayed]

FINDINGS: Acute nondisplaced fracture through the radial base of the distal
phalanx of the long finger. Fracture line visible only on a single
view but fairly definitive in appearance. There is associated soft
tissue swelling.
IMPRESSION: Acute nondisplaced fracture through the radial base of the distal
phalanx of the long finger. Findings may represent avulsion fracture
from the radial collateral ligament.

## 2023-10-12 DIAGNOSIS — L648 Other androgenic alopecia: Secondary | ICD-10-CM | POA: Diagnosis not present

## 2023-10-12 DIAGNOSIS — L650 Telogen effluvium: Secondary | ICD-10-CM | POA: Diagnosis not present

## 2023-12-12 DIAGNOSIS — L700 Acne vulgaris: Secondary | ICD-10-CM | POA: Diagnosis not present

## 2023-12-12 DIAGNOSIS — L739 Follicular disorder, unspecified: Secondary | ICD-10-CM | POA: Diagnosis not present

## 2023-12-12 DIAGNOSIS — D225 Melanocytic nevi of trunk: Secondary | ICD-10-CM | POA: Diagnosis not present

## 2023-12-12 DIAGNOSIS — L2089 Other atopic dermatitis: Secondary | ICD-10-CM | POA: Diagnosis not present

## 2024-02-26 DIAGNOSIS — E7800 Pure hypercholesterolemia, unspecified: Secondary | ICD-10-CM | POA: Diagnosis not present

## 2024-02-26 DIAGNOSIS — Z0000 Encounter for general adult medical examination without abnormal findings: Secondary | ICD-10-CM | POA: Diagnosis not present

## 2024-02-26 DIAGNOSIS — Z23 Encounter for immunization: Secondary | ICD-10-CM | POA: Diagnosis not present

## 2024-02-26 DIAGNOSIS — N943 Premenstrual tension syndrome: Secondary | ICD-10-CM | POA: Diagnosis not present

## 2024-02-26 DIAGNOSIS — F419 Anxiety disorder, unspecified: Secondary | ICD-10-CM | POA: Diagnosis not present

## 2024-02-26 DIAGNOSIS — L209 Atopic dermatitis, unspecified: Secondary | ICD-10-CM | POA: Diagnosis not present

## 2024-04-09 ENCOUNTER — Ambulatory Visit: Payer: PRIVATE HEALTH INSURANCE | Admitting: Dermatology
